# Patient Record
Sex: Male | Born: 2000 | Race: White | Hispanic: No | Marital: Single | Smoking: Never smoker
Health system: Southern US, Community
[De-identification: ages and names within clinical notes are randomized; demographics above are authoritative.]

## PROBLEM LIST (undated history)

## (undated) DIAGNOSIS — L0291 Cutaneous abscess, unspecified: Secondary | ICD-10-CM

## (undated) DIAGNOSIS — L0390 Cellulitis, unspecified: Secondary | ICD-10-CM

## (undated) DIAGNOSIS — Z8709 Personal history of other diseases of the respiratory system: Secondary | ICD-10-CM

## (undated) HISTORY — DX: Cellulitis, unspecified: L03.90

## (undated) HISTORY — DX: Personal history of other diseases of the respiratory system: Z87.09

## (undated) HISTORY — PX: APPENDECTOMY: SHX54

## (undated) HISTORY — PX: ADENOIDECTOMY: SHX5191

## (undated) HISTORY — PX: TONSILLECTOMY: SUR1361

## (undated) HISTORY — DX: Cutaneous abscess, unspecified: L02.91

---
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
## 2022-01-03 ENCOUNTER — Ambulatory Visit: Payer: Managed Care, Other (non HMO) | Admitting: Physician Assistant

## 2022-01-10 ENCOUNTER — Encounter: Payer: Self-pay | Admitting: Physician Assistant

## 2022-01-10 ENCOUNTER — Ambulatory Visit (INDEPENDENT_AMBULATORY_CARE_PROVIDER_SITE_OTHER): Payer: Managed Care, Other (non HMO) | Admitting: Physician Assistant

## 2022-01-10 VITALS — BP 134/63 | HR 95 | Ht 69.0 in | Wt 289.1 lb

## 2022-01-10 DIAGNOSIS — I10 Essential (primary) hypertension: Secondary | ICD-10-CM | POA: Diagnosis not present

## 2022-01-10 DIAGNOSIS — R7989 Other specified abnormal findings of blood chemistry: Secondary | ICD-10-CM | POA: Diagnosis not present

## 2022-01-10 DIAGNOSIS — Z23 Encounter for immunization: Secondary | ICD-10-CM

## 2022-01-10 MED ORDER — LISINOPRIL 5 MG PO TABS: 5.0000 mg | ORAL_TABLET | Freq: Every day | ORAL | 1 refills | Status: DC

## 2022-01-10 NOTE — Progress Notes (Signed)
   Established Patient Office Visit  Subjective   Patient ID: Jordan Cordova, male    DOB: 11-21-2000  Age: 22 y.o. MRN: [id number]  Chief Complaint  Patient presents with   Follow-up    6 month      HPI Pt is a 21 yo male with HTN and elevated TSH who presents to the clinic for follow up and medication refills.   Pt is doing great today. Pt denies any concerns or problems. He denies any CP, palpitations, headaches or vision changes. He is compliant with medications.   .. Active Ambulatory Problems    Diagnosis Date Noted   Elevated blood pressure reading 08/10/2020   Tachycardia 08/10/2020   Class 3 severe obesity due to excess calories without serious comorbidity with body mass index (BMI) of 40.0 to 44.9 in adult [facility]) 08/10/2020   Microalbuminuria 08/10/2020   Elevated liver enzymes 08/11/2020   Elevated TSH 08/11/2020   Primary hypertension 09/10/2020   Mixed dyslipidemia 01/02/2012   Resolved Ambulatory Problems    Diagnosis Date Noted   ALLERGIC RHINITIS 05/24/2009   CELLULITIS, METHICILLIN RESISTANT STAPHYLOCCOCUS AREUS 05/24/2009   OTITIS MEDIA, ACUTE 09/01/2010   Past Medical History:  Diagnosis Date   Asthma    History of strep pharyngitis      ROS   See HPI.  Objective:     BP 134/63   Pulse 95   Ht 5\' 9"  (1.753 m)   Wt 289 lb 1.9 oz (131.1 kg)   SpO2 97%   BMI 42.70 kg/m  BP Readings from Last 3 Encounters:  01/10/22 134/63  07/01/21 118/88  01/13/21 140/75   Wt Readings from Last 3 Encounters:  01/10/22 289 lb 1.9 oz (131.1 kg) (>99 %, Z= 2.94)*  07/01/21 281 lb (127.5 kg) (>99 %, Z= 2.84)*  01/13/21 267 lb (121.1 kg) (>99 %, Z= 2.67)*   * Growth percentiles are based on CDC (Boys, 2-20 Years) data.      Physical Exam   Assessment & Plan:  Marland KitchenMarland KitchenAsante was seen today for follow-up.  Diagnoses and all orders for this visit:  Primary hypertension -     COMPLETE METABOLIC PANEL WITH GFR -     lisinopril (ZESTRIL) 5 MG tablet; Take  1 tablet (5 mg total) by mouth daily.  Elevated TSH -     TSH  Need for influenza vaccination -     Flu Vaccine QUAD 6+ mos PF IM (Fluarix Quad PF)   BP to goal Lisinopril 5mg  sent to pharmacy Cmp ordered  TSH to recheck and adjust medications accordingly  Flu shot given today    Return in about 6 months (around 07/11/2022).    Iran Planas, PA-C

## 2022-01-11 ENCOUNTER — Other Ambulatory Visit: Payer: Self-pay | Admitting: Physician Assistant

## 2022-01-11 LAB — COMPLETE METABOLIC PANEL WITH GFR
AG Ratio: 1.8 (calc) (ref 1.0–2.5)
ALT: 70 U/L — ABNORMAL HIGH (ref 8–46)
AST: 27 U/L (ref 12–32)
Albumin: 4.5 g/dL (ref 3.6–5.1)
Alkaline phosphatase (APISO): 67 U/L (ref 46–169)
BUN: 10 mg/dL (ref 7–20)
CO2: 27 mmol/L (ref 20–32)
Calcium: 9.5 mg/dL (ref 8.9–10.4)
Chloride: 105 mmol/L (ref 98–110)
Creat: 0.67 mg/dL (ref 0.60–1.24)
Globulin: 2.5 g/dL (calc) (ref 2.1–3.5)
Glucose, Bld: 111 mg/dL — ABNORMAL HIGH (ref 65–99)
Potassium: 4.2 mmol/L (ref 3.8–5.1)
Sodium: 140 mmol/L (ref 135–146)
Total Bilirubin: 0.5 mg/dL (ref 0.2–1.1)
Total Protein: 7 g/dL (ref 6.3–8.2)
eGFR: 138 mL/min/{1.73_m2} (ref >=60)

## 2022-01-11 LAB — TSH: TSH: 1.6 mIU/L (ref 0.50–4.30)

## 2022-01-11 MED ORDER — LEVOTHYROXINE SODIUM 25 MCG PO TABS: 25.0000 ug | ORAL_TABLET | Freq: Every day | ORAL | 3 refills | Status: DC

## 2022-01-11 NOTE — Progress Notes (Signed)
Thyroid looks perfect!  ALT continues to be elevated. Are you drinking alcohol? If so how much?

## 2022-01-12 ENCOUNTER — Encounter: Payer: Self-pay | Admitting: Neurology

## 2022-07-11 ENCOUNTER — Ambulatory Visit: Payer: Managed Care, Other (non HMO) | Admitting: Physician Assistant

## 2022-07-17 ENCOUNTER — Encounter: Payer: Self-pay | Admitting: Physician Assistant

## 2022-07-17 ENCOUNTER — Ambulatory Visit: Payer: Managed Care, Other (non HMO) | Admitting: Physician Assistant

## 2022-07-17 VITALS — BP 145/87 | HR 93 | Ht 69.0 in | Wt 312.0 lb

## 2022-07-17 DIAGNOSIS — Z23 Encounter for immunization: Secondary | ICD-10-CM | POA: Diagnosis not present

## 2022-07-17 DIAGNOSIS — I10 Essential (primary) hypertension: Secondary | ICD-10-CM

## 2022-07-17 DIAGNOSIS — E039 Hypothyroidism, unspecified: Secondary | ICD-10-CM

## 2022-07-17 DIAGNOSIS — R7401 Elevation of levels of liver transaminase levels: Secondary | ICD-10-CM

## 2022-07-17 DIAGNOSIS — R7989 Other specified abnormal findings of blood chemistry: Secondary | ICD-10-CM | POA: Diagnosis not present

## 2022-07-17 DIAGNOSIS — Z6841 Body Mass Index (BMI) 40.0 and over, adult: Secondary | ICD-10-CM

## 2022-07-17 DIAGNOSIS — Z13220 Encounter for screening for lipoid disorders: Secondary | ICD-10-CM

## 2022-07-17 MED ORDER — LISINOPRIL 10 MG PO TABS: 10.0000 mg | ORAL_TABLET | Freq: Every day | ORAL | 1 refills | Status: DC

## 2022-07-17 NOTE — Patient Instructions (Signed)
Increase lisinopril to 10mg .  Get labs when fasting.

## 2022-07-17 NOTE — Progress Notes (Signed)
Established Patient Office Visit  Subjective   Patient ID: Jordan Cordova, male    DOB: 2000-08-13  Age: 22 y.o. MRN: [id number]  Chief Complaint  Patient presents with   Follow-up    HPI Pt is a 22 yo obese male with HTN and hypothyroidism who presents to the clinic for follow up and medication refills.   He is doing really good. No concerns. Not checking BP at home. No CP, palpitations, SOB, headaches, or vision changes. He does feel better with thyroid medications.    .. Active Ambulatory Problems    Diagnosis Date Noted   Elevated blood pressure reading 08/10/2020   Tachycardia 08/10/2020   Class 3 severe obesity due to excess calories without serious comorbidity with body mass index (BMI) of 40.0 to 44.9 in adult [facility]) 08/10/2020   Microalbuminuria 08/10/2020   Elevated liver enzymes 08/11/2020   Elevated TSH 08/11/2020   Primary hypertension 09/10/2020   Mixed dyslipidemia 01/02/2012   Acquired hypothyroidism 07/17/2022   Resolved Ambulatory Problems    Diagnosis Date Noted   ALLERGIC RHINITIS 05/24/2009   CELLULITIS, METHICILLIN RESISTANT STAPHYLOCCOCUS AREUS 05/24/2009   OTITIS MEDIA, ACUTE 09/01/2010   Past Medical History:  Diagnosis Date   Asthma    History of strep pharyngitis      Review of Systems  All other systems reviewed and are negative.     Objective:     BP (!) 145/87   Pulse 93   Ht 5\' 9"  (1.753 m)   Wt (!) 312 lb (141.5 kg)   SpO2 99%   BMI 46.07 kg/m  BP Readings from Last 3 Encounters:  07/17/22 (!) 145/87  01/10/22 134/63  07/01/21 118/88   Wt Readings from Last 3 Encounters:  07/17/22 (!) 312 lb (141.5 kg)  01/10/22 289 lb 1.9 oz (131.1 kg) (>99 %, Z= 2.94)*  07/01/21 281 lb (127.5 kg) (>99 %, Z= 2.84)*   * Growth percentiles are based on CDC (Boys, 2-20 Years) data.      Physical Exam Constitutional:      Appearance: Normal appearance. He is obese.  HENT:     Head: Normocephalic.  Cardiovascular:     Rate and  Rhythm: Normal rate and regular rhythm.  Pulmonary:     Effort: Pulmonary effort is normal.     Breath sounds: Normal breath sounds.  Musculoskeletal:     Right lower leg: No edema.     Left lower leg: No edema.  Neurological:     General: No focal deficit present.     Mental Status: He is alert and oriented to person, place, and time.  Psychiatric:        Mood and Affect: Mood normal.       Assessment & Plan:  Marland KitchenMarland KitchenMomin was seen today for follow-up.  Diagnoses and all orders for this visit:  Primary hypertension -     COMPLETE METABOLIC PANEL WITH GFR -     lisinopril (ZESTRIL) 10 MG tablet; Take 1 tablet (10 mg total) by mouth daily.  Elevated TSH -     TSH  Screening for lipid disorders -     Lipid Panel w/reflex Direct LDL  Need for HPV vaccine -     HPV 9-valent vaccine,Recombinat  Acquired hypothyroidism   BP not to goal Increased lisinopril to 10mg  daily Screening labs to follow medications Will adjust levothyroxine accordingly HPV vaccine 3rd dose today.  Follow up in 6 months    Return in about 6 months (  around 01/17/2023), or if symptoms worsen or fail to improve.    Tandy Gaw, PA-C

## 2022-07-25 LAB — LIPID PANEL W/REFLEX DIRECT LDL
Cholesterol: 181 mg/dL (ref <200)
HDL: 44 mg/dL (ref >=40)
LDL Cholesterol (Calc): 120 mg/dL (calc) — ABNORMAL HIGH
Non-HDL Cholesterol (Calc): 137 mg/dL (calc) — ABNORMAL HIGH (ref <130)
Total CHOL/HDL Ratio: 4.1 (calc) (ref <5.0)
Triglycerides: 78 mg/dL (ref <150)

## 2022-07-25 LAB — COMPLETE METABOLIC PANEL WITH GFR
AG Ratio: 1.8 (calc) (ref 1.0–2.5)
ALT: 82 U/L — ABNORMAL HIGH (ref 9–46)
AST: 30 U/L (ref 10–40)
Albumin: 4.5 g/dL (ref 3.6–5.1)
Alkaline phosphatase (APISO): 58 U/L (ref 36–130)
BUN: 10 mg/dL (ref 7–25)
CO2: 26 mmol/L (ref 20–32)
Calcium: 9.4 mg/dL (ref 8.6–10.3)
Chloride: 104 mmol/L (ref 98–110)
Creat: 0.68 mg/dL (ref 0.60–1.24)
Globulin: 2.5 g/dL (calc) (ref 1.9–3.7)
Glucose, Bld: 84 mg/dL (ref 65–99)
Potassium: 4.4 mmol/L (ref 3.5–5.3)
Sodium: 139 mmol/L (ref 135–146)
Total Bilirubin: 0.8 mg/dL (ref 0.2–1.2)
Total Protein: 7 g/dL (ref 6.1–8.1)
eGFR: 136 mL/min/{1.73_m2} (ref >=60)

## 2022-07-25 LAB — TSH: TSH: 2.95 mIU/L (ref 0.40–4.50)

## 2022-07-25 NOTE — Progress Notes (Signed)
Filbert,  Thyroid normal range. Make sure to take medication first thing in morning without any other food or medication.  LDL 120, not optimal, but ok for age and risk factors. Continue to work on [employer] and exercise.  ALT up a little. I would like to get liver ultrasound. Are you drinking alcohol? Are you taking tylenol regularly?

## 2022-07-25 NOTE — Addendum Note (Signed)
Addended byTandy Gaw L on: 07/25/2022 01:00 PM   Modules accepted: Orders

## 2023-01-02 ENCOUNTER — Encounter: Payer: Self-pay | Admitting: Physician Assistant

## 2023-01-12 ENCOUNTER — Ambulatory Visit: Payer: Managed Care, Other (non HMO)

## 2023-01-12 DIAGNOSIS — R7401 Elevation of levels of liver transaminase levels: Secondary | ICD-10-CM | POA: Diagnosis not present

## 2023-01-12 DIAGNOSIS — E66813 Obesity, class 3: Secondary | ICD-10-CM

## 2023-01-16 NOTE — Progress Notes (Signed)
Jordan Cordova,   Liver is showing some that can be seen in cirrhosis.  We need to get a special electrography u/s to look for signs that the liver is hardening.  Are you ok with this?  Stop drinking any alcohol.

## 2023-01-17 ENCOUNTER — Ambulatory Visit: Payer: Managed Care, Other (non HMO) | Admitting: Physician Assistant

## 2023-01-17 ENCOUNTER — Encounter: Payer: Self-pay | Admitting: Physician Assistant

## 2023-01-17 VITALS — BP 128/82 | HR 80 | Ht 69.0 in | Wt 314.0 lb

## 2023-01-17 DIAGNOSIS — R748 Abnormal levels of other serum enzymes: Secondary | ICD-10-CM | POA: Diagnosis not present

## 2023-01-17 DIAGNOSIS — I10 Essential (primary) hypertension: Secondary | ICD-10-CM | POA: Diagnosis not present

## 2023-01-17 DIAGNOSIS — Z23 Encounter for immunization: Secondary | ICD-10-CM

## 2023-01-17 DIAGNOSIS — K760 Fatty (change of) liver, not elsewhere classified: Secondary | ICD-10-CM | POA: Diagnosis not present

## 2023-01-17 DIAGNOSIS — R7989 Other specified abnormal findings of blood chemistry: Secondary | ICD-10-CM | POA: Diagnosis not present

## 2023-01-17 DIAGNOSIS — E66813 Obesity, class 3: Secondary | ICD-10-CM

## 2023-01-17 DIAGNOSIS — Z6841 Body Mass Index (BMI) 40.0 and over, adult: Secondary | ICD-10-CM

## 2023-01-17 MED ORDER — ZEPBOUND 2.5 MG/0.5ML ~~LOC~~ SOAJ: 2.5000 mg | SUBCUTANEOUS | 0 refills | Status: DC

## 2023-01-17 MED ORDER — LEVOTHYROXINE SODIUM 25 MCG PO TABS: 25.0000 ug | ORAL_TABLET | Freq: Every day | ORAL | 3 refills | Status: DC

## 2023-01-17 MED ORDER — LISINOPRIL 10 MG PO TABS: 10.0000 mg | ORAL_TABLET | Freq: Every day | ORAL | 1 refills | Status: DC

## 2023-01-17 NOTE — Progress Notes (Signed)
Established Patient Office Visit  Subjective   Patient ID: Jordan Cordova, male    DOB: [birth year]  Age: 22 y.o. MRN: [id number]  Chief Complaint  Patient presents with   Medical Management of Chronic Issues    6 mo fup rx refills and labs     HPI Pt is a 22 yo obese male with HTN, hypothyroidism who presents to the clinic for follow up on medications and elevated liver enzymes.   Pt has had mildly elevated liver enzymes that are trending up with u/s that showed fatty liver and questionable signs of cirrhosis. Pt does not drink alcohol or take tylenol products. He is not exercising.   He denies any CP, palpitations, headaches or vision changes. He is compliant with medications.   Active Ambulatory Problems    Diagnosis Date Noted   Elevated blood pressure reading 08/10/2020   Tachycardia 08/10/2020   Class 3 severe obesity due to excess calories with serious comorbidity and body mass index (BMI) of 40.0 to 44.9 in adult [facility]) 08/10/2020   Microalbuminuria 08/10/2020   Elevated liver enzymes 08/11/2020   Elevated TSH 08/11/2020   Primary hypertension 09/10/2020   Mixed dyslipidemia 01/02/2012   Acquired hypothyroidism 07/17/2022   Fatty liver disease, nonalcoholic 01/17/2023   Resolved Ambulatory Problems    Diagnosis Date Noted   Allergic rhinitis 05/24/2009   CELLULITIS, METHICILLIN RESISTANT STAPHYLOCCOCUS AREUS 05/24/2009   Otitis media 09/01/2010   Past Medical History:  Diagnosis Date   Asthma    History of strep pharyngitis      ROS   See HPI.  Objective:     BP 128/82  BP Readings from Last 3 Encounters:  01/17/23 128/82  07/17/22 (!) 145/87  01/10/22 134/63   Wt Readings from Last 3 Encounters:  07/17/22 (!) 312 lb (141.5 kg)  01/10/22 289 lb 1.9 oz (131.1 kg) (>99%, Z= 2.94)*  07/01/21 281 lb (127.5 kg) (>99%, Z= 2.84)*   * Growth percentiles are based on CDC (Boys, 2-20 Years) data.      Physical Exam Constitutional:      Appearance:  Normal appearance. He is obese.  HENT:     Head: Normocephalic.  Cardiovascular:     Rate and Rhythm: Normal rate and regular rhythm.  Pulmonary:     Effort: Pulmonary effort is normal.  Neurological:     General: No focal deficit present.     Mental Status: He is alert and oriented to person, place, and time.  Psychiatric:        Mood and Affect: Mood normal.       Assessment & Plan:  Marland KitchenMarland KitchenYama was seen today for medical management of chronic issues.  Diagnoses and all orders for this visit:  Fatty liver disease, nonalcoholic -     tirzepatide (ZEPBOUND) 2.5 MG/0.5ML Pen; Inject 2.5 mg into the skin once a week. -     CMP14+EGFR -     [place] ELASTOGRAPHY LIVER; Future  Primary hypertension -     lisinopril (ZESTRIL) 10 MG tablet; Take 1 tablet (10 mg total) by mouth daily. -     tirzepatide (ZEPBOUND) 2.5 MG/0.5ML Pen; Inject 2.5 mg into the skin once a week. 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 -     tirzepatide (ZEPBOUND) 2.5 MG/0.5ML Pen; Inject 2.5 mg into the skin once a week. -     TSH  Elevated liver enzymes -     tirzepatide (ZEPBOUND) 2.5 MG/0.5ML Pen; Inject 2.5 mg into the skin once a week. -     [place] ELASTOGRAPHY LIVER; Future  Class 3 severe obesity due to excess calories with serious comorbidity and body mass index (BMI) of 40.0 to 44.9 in adult (HCC) -     tirzepatide (ZEPBOUND) 2.5 MG/0.5ML Pen; Inject 2.5 mg into the skin once a week. -     [place] ELASTOGRAPHY LIVER; Future   2nd recheck BP looks good Continue on lisinopril daily  TSH to recheck today and adjust accordingly  Will order elastography u/s for elevated liver enzymes We need to work on weight loss Discussed zepbound and pt agreed Pt  aware of side effects and titration up Discussed regular exercise and diet changes to get best results Sent first dose and follow up in 4 weeks  Flu and covid vaccines given today  Tandy Gaw, PA-C

## 2023-01-17 NOTE — Patient Instructions (Signed)
Work on 150 minutes of exercise a week.  Start zepbound weekly.  Follow up in 1 month.

## 2023-01-18 ENCOUNTER — Telehealth: Payer: Self-pay

## 2023-01-18 LAB — CMP14+EGFR
ALT: 92 [IU]/L — ABNORMAL HIGH (ref 0–44)
AST: 39 [IU]/L (ref 0–40)
Albumin: 4.4 g/dL (ref 4.3–5.2)
Alkaline Phosphatase: 71 [IU]/L (ref 51–125)
BUN/Creatinine Ratio: 14 (ref 9–20)
BUN: 9 mg/dL (ref 6–20)
Bilirubin Total: 0.3 mg/dL (ref 0.0–1.2)
CO2: 21 mmol/L (ref 20–29)
Calcium: 9.4 mg/dL (ref 8.7–10.2)
Chloride: 102 mmol/L (ref 96–106)
Creatinine, Ser: 0.64 mg/dL — ABNORMAL LOW (ref 0.76–1.27)
Globulin, Total: 2.5 g/dL (ref 1.5–4.5)
Glucose: 97 mg/dL (ref 70–99)
Potassium: 4.9 mmol/L (ref 3.5–5.2)
Sodium: 139 mmol/L (ref 134–144)
Total Protein: 6.9 g/dL (ref 6.0–8.5)
eGFR: 139 mL/min/{1.73_m2} (ref >59)

## 2023-01-18 LAB — TSH: TSH: 1.74 u[IU]/mL (ref 0.450–4.500)

## 2023-01-18 NOTE — Telephone Encounter (Signed)
Initiated Prior authorization for: Via: Covermymeds Case/Key:[facility] Status: approved  as of 01/18/23 Reason:Authorization Expiration Date: 09/14/2023 Notified Pt via: Mychart

## 2023-01-19 NOTE — Progress Notes (Signed)
Thyroid looks great. Stay on same dose.   One liver enzymes continues to climb a little at a time. Treatment plan stays the same for now.

## 2023-02-09 ENCOUNTER — Other Ambulatory Visit: Payer: Self-pay | Admitting: Physician Assistant

## 2023-02-09 DIAGNOSIS — K760 Fatty (change of) liver, not elsewhere classified: Secondary | ICD-10-CM

## 2023-02-09 DIAGNOSIS — I10 Essential (primary) hypertension: Secondary | ICD-10-CM

## 2023-02-09 DIAGNOSIS — E66813 Obesity, class 3: Secondary | ICD-10-CM

## 2023-02-09 DIAGNOSIS — R7989 Other specified abnormal findings of blood chemistry: Secondary | ICD-10-CM

## 2023-02-09 DIAGNOSIS — R748 Abnormal levels of other serum enzymes: Secondary | ICD-10-CM

## 2023-02-12 MED ORDER — ZEPBOUND 2.5 MG/0.5ML ~~LOC~~ SOAJ: 2.5000 mg | SUBCUTANEOUS | 0 refills | Status: DC

## 2023-02-16 ENCOUNTER — Encounter: Payer: Self-pay | Admitting: Physician Assistant

## 2023-02-16 DIAGNOSIS — I10 Essential (primary) hypertension: Secondary | ICD-10-CM

## 2023-02-16 DIAGNOSIS — K760 Fatty (change of) liver, not elsewhere classified: Secondary | ICD-10-CM

## 2023-02-16 DIAGNOSIS — E039 Hypothyroidism, unspecified: Secondary | ICD-10-CM

## 2023-02-16 DIAGNOSIS — R7989 Other specified abnormal findings of blood chemistry: Secondary | ICD-10-CM

## 2023-02-16 DIAGNOSIS — R748 Abnormal levels of other serum enzymes: Secondary | ICD-10-CM

## 2023-02-16 MED ORDER — ZEPBOUND 5 MG/0.5ML ~~LOC~~ SOAJ: 5.0000 mg | SUBCUTANEOUS | 0 refills | Status: DC

## 2023-02-16 MED ORDER — ZEPBOUND 7.5 MG/0.5ML ~~LOC~~ SOAJ: 7.5000 mg | SUBCUTANEOUS | 0 refills | Status: DC

## 2023-04-20 ENCOUNTER — Other Ambulatory Visit: Payer: Self-pay | Admitting: Physician Assistant

## 2023-04-24 MED ORDER — ZEPBOUND 7.5 MG/0.5ML ~~LOC~~ SOAJ: 7.5000 mg | SUBCUTANEOUS | 0 refills | Status: DC

## 2023-06-06 ENCOUNTER — Other Ambulatory Visit: Payer: Self-pay | Admitting: Physician Assistant

## 2023-06-07 MED ORDER — ZEPBOUND 7.5 MG/0.5ML ~~LOC~~ SOAJ: 7.5000 mg | SUBCUTANEOUS | 0 refills | Status: DC

## 2023-07-04 NOTE — Telephone Encounter (Signed)
 This request has been handled. No further action is required. Please review other telephone encounters for additional information.

## 2023-07-27 ENCOUNTER — Other Ambulatory Visit: Payer: Self-pay | Admitting: Physician Assistant

## 2023-07-31 NOTE — Telephone Encounter (Signed)
Attempted call to patient. Mail box full. Could not leave a voice mail message.  

## 2023-08-01 NOTE — Telephone Encounter (Signed)
Attempted call to patient. Mail box full. Could not leave a voice mail message.  

## 2023-08-06 ENCOUNTER — Encounter: Payer: Self-pay | Admitting: Physician Assistant

## 2023-08-06 NOTE — Telephone Encounter (Signed)
 Letter placed in mail to patient

## 2023-08-21 ENCOUNTER — Other Ambulatory Visit (HOSPITAL_COMMUNITY): Payer: Self-pay

## 2023-08-21 ENCOUNTER — Telehealth: Payer: Self-pay

## 2023-08-21 NOTE — Telephone Encounter (Signed)
 Pt is scheduled for an appt 6/25.

## 2023-08-21 NOTE — Telephone Encounter (Signed)
 Pharmacy Patient Advocate Encounter   Received notification from CoverMyMeds that prior authorization for Zepbound  is due for renewal.   Insurance verification completed.   The patient is insured through [employer].   Action: Patient hasn't been seen in your office in over 6 months. Plan requires updated chart notes for PA renewal.

## 2023-08-27 ENCOUNTER — Encounter: Payer: Self-pay | Admitting: Physician Assistant

## 2023-08-27 ENCOUNTER — Ambulatory Visit: Admitting: Physician Assistant

## 2023-08-27 VITALS — BP 124/68 | HR 76 | Ht 69.0 in | Wt 306.0 lb

## 2023-08-27 DIAGNOSIS — Z6841 Body Mass Index (BMI) 40.0 and over, adult: Secondary | ICD-10-CM

## 2023-08-27 DIAGNOSIS — R7989 Other specified abnormal findings of blood chemistry: Secondary | ICD-10-CM

## 2023-08-27 DIAGNOSIS — R748 Abnormal levels of other serum enzymes: Secondary | ICD-10-CM

## 2023-08-27 DIAGNOSIS — E66813 Obesity, class 3: Secondary | ICD-10-CM | POA: Diagnosis not present

## 2023-08-27 DIAGNOSIS — K760 Fatty (change of) liver, not elsewhere classified: Secondary | ICD-10-CM | POA: Diagnosis not present

## 2023-08-27 DIAGNOSIS — I10 Essential (primary) hypertension: Secondary | ICD-10-CM

## 2023-08-27 MED ORDER — LISINOPRIL 10 MG PO TABS: 10.0000 mg | ORAL_TABLET | Freq: Every day | ORAL | 1 refills | Status: AC

## 2023-08-27 MED ORDER — LEVOTHYROXINE SODIUM 25 MCG PO TABS
25.0000 ug | ORAL_TABLET | Freq: Every day | ORAL | 1 refills | Status: AC
Start: 2023-08-27 — End: ?

## 2023-08-27 MED ORDER — ZEPBOUND 12.5 MG/0.5ML ~~LOC~~ SOAJ: 12.5000 mg | SUBCUTANEOUS | 1 refills | Status: AC

## 2023-08-27 MED ORDER — ZEPBOUND 10 MG/0.5ML ~~LOC~~ SOAJ: 10.0000 mg | SUBCUTANEOUS | 0 refills | Status: AC

## 2023-08-27 NOTE — Progress Notes (Signed)
   Established Patient Office Visit  Subjective   Patient ID: Jordan Cordova, male    DOB: 2000-05-31  Age: 23 y.o. MRN: [id number]  Chief Complaint  Patient presents with   Medical Management of Chronic Issues    Weight management      HPI Pt is a 23 yo obese male who presents to the clinic for weight loss follow up. He has been on zepbound  7.5mg  for 3 months. He has lost 7lbs. He denies any side effects. He really feels like the dose is not lasting all week. He is not exercising and not following a diet plan. He would like to go up to the next dose.   He is not checking BP at home. He is taking lisinopril  daily. He denies any CP, palpitations, headaches or vision changes.   He is taking synthroid  daily for thyroid.    ROS See HPI.    Objective:     BP 124/68   Pulse 76   Ht 5' 9 (1.753 m)   Wt (!) 306 lb (138.8 kg)   SpO2 99%   BMI 45.19 kg/m  BP Readings from Last 3 Encounters:  08/27/23 124/68  01/17/23 128/82  07/17/22 (!) 145/87   Wt Readings from Last 3 Encounters:  08/27/23 (!) 306 lb (138.8 kg)  01/17/23 (!) 314 lb (142.4 kg)  07/17/22 (!) 312 lb (141.5 kg)      Physical Exam Constitutional:      Appearance: Normal appearance. He is obese.  HENT:     Head: Normocephalic.   Cardiovascular:     Rate and Rhythm: Normal rate and regular rhythm.  Pulmonary:     Effort: Pulmonary effort is normal.   Neurological:     General: No focal deficit present.     Mental Status: He is alert and oriented to person, place, and time.   Psychiatric:        Mood and Affect: Mood normal.        Assessment & Plan:  Jordan Cordova was seen today for medical management of chronic issues.  Diagnoses and all orders for this visit:  Class 3 severe obesity due to excess calories without serious comorbidity with body mass index (BMI) of 45.0 to 49.9 in adult -     CMP14+EGFR -     tirzepatide  (ZEPBOUND ) 10 MG/0.5ML Pen; Inject 10 mg into the skin once a week. -      tirzepatide  (ZEPBOUND ) 12.5 MG/0.5ML Pen; Inject 12.5 mg into the skin once a week.  Fatty liver disease, nonalcoholic -     CMP14+EGFR  Elevated liver enzymes -     CMP14+EGFR  Elevated TSH -     levothyroxine  (SYNTHROID ) 25 MCG tablet; Take 1 tablet (25 mcg total) by mouth daily before breakfast.  Primary hypertension -     lisinopril  (ZESTRIL ) 10 MG tablet; Take 1 tablet (10 mg total) by mouth daily.   Increased zepbound  to 10mg  and then 12.5mg  in one month Discussed SEs Strongly encouraged patient to start with a clear diet plan and goal of exercising more regularly at least 3 times a week 2nd BP check was great, refilled lisinopril  Encouraged to start checking BP at home and keeping log Cmp ordered to recheck liver enzymes TSH looked great, refilled synthroid .  Follow up in 3 months.   Ellasyn Swilling, PA-C

## 2023-08-27 NOTE — Patient Instructions (Signed)
 Obesity, Adult Obesity is having too much body fat. Being obese means that your weight is more than what is healthy for you.  BMI (body mass index) is a number that explains how much body fat you have. If you have a BMI of 30 or more, you are obese. Obesity can cause serious health problems, such as: Stroke. Coronary artery disease (CAD). Type 2 diabetes. Some types of cancer. High blood pressure (hypertension). High cholesterol. Gallbladder stones. Obesity can also contribute to: Osteoarthritis. Sleep apnea. Infertility problems. What are the causes? Eating meals each day that are high in calories, sugar, and fat. Drinking a lot of drinks that have sugar in them. Being born with genes that may make you more likely to become obese. Having a medical condition that causes obesity. Taking certain medicines. Sitting a lot (having a sedentary lifestyle). Not getting enough sleep. What increases the risk? Having a family history of obesity. Living in an area with limited access to: [place], recreation centers, or sidewalks. Healthy food choices, such as grocery stores and farmers' markets. What are the signs or symptoms? The main sign is having too much body fat. How is this treated? Treatment for this condition often includes changing your lifestyle. Treatment may include: Changing your diet. This may include making a healthy meal plan. Exercise. This may include activity that causes your heart to beat faster (aerobic exercise) and strength training. Work with your doctor to design a program that works for you. Medicine to help you lose weight. This may be used if you are not able to lose one pound a week after 6 weeks of healthy eating and more exercise. Treating conditions that cause the obesity. Surgery. Options may include gastric banding and gastric bypass. This may be done if: Other treatments have not helped to improve your condition. You have a BMI of 40 or higher. You have  life-threatening health problems related to obesity. Follow these instructions at home: Eating and drinking  Follow advice from your doctor about what to eat and drink. Your doctor may tell you to: Limit fast food, sweets, and processed snack foods. Choose low-fat options. For example, choose low-fat milk instead of whole milk. Eat five or more servings of fruits or vegetables each day. Eat at home more often. This gives you more control over what you eat. Choose healthy foods when you eat out. Learn to read food labels. This will help you learn how much food is in one serving. Keep low-fat snacks available. Avoid drinks that have a lot of sugar in them. These include soda, fruit juice, iced tea with sugar, and flavored milk. Drink enough water to keep your pee (urine) pale yellow. Do not go on fad diets. Physical activity Exercise often, as told by your doctor. Most adults should get up to 150 minutes of moderate-intensity exercise every week.Ask your doctor: What types of exercise are safe for you. How often you should exercise. Warm up and stretch before being active. Do slow stretching after being active (cool down). Rest between times of being active. Lifestyle Work with your doctor and a food expert (dietitian) to set a weight-loss goal that is best for you. Limit your screen time. Find ways to reward yourself that do not involve food. Do not drink alcohol if: Your doctor tells you not to drink. You are pregnant, may be pregnant, or are planning to become pregnant. If you drink alcohol: Limit how much you have to: 0-1 drink a day for women. 0-2 drinks  a day for men. Know how much alcohol is in your drink. In the [place] one drink equals one 12 oz bottle of beer (355 mL), one 5 oz glass of wine (148 mL), or one 1 oz glass of hard liquor (44 mL). General instructions Keep a weight-loss journal. This can help you keep track of: The food that you eat. How much exercise you  get. Take over-the-counter and prescription medicines only as told by your doctor. Take vitamins and supplements only as told by your doctor. Think about joining a support group. Pay attention to your mental health as obesity can lead to depression or self esteem issues. Keep all follow-up visits. Contact a doctor if: You cannot meet your weight-loss goal after you have changed your diet and lifestyle for 6 weeks. You are having trouble breathing. Summary Obesity is having too much body fat. Being obese means that your weight is more than what is healthy for you. Work with your doctor to set a weight-loss goal. Get regular exercise as told by your doctor. This information is not intended to replace advice given to you by your health care provider. Make sure you discuss any questions you have with your health care provider. Document Revised: 09/28/2020 Document Reviewed: 09/28/2020 Elsevier Patient Education  2024 [employer].

## 2023-08-28 ENCOUNTER — Ambulatory Visit: Payer: Self-pay | Admitting: Physician Assistant

## 2023-08-28 LAB — CMP14+EGFR
ALT: 78 IU/L — ABNORMAL HIGH (ref 0–44)
AST: 36 IU/L (ref 0–40)
Albumin: 4.6 g/dL (ref 4.3–5.2)
Alkaline Phosphatase: 65 IU/L (ref 44–121)
BUN/Creatinine Ratio: 14 (ref 9–20)
BUN: 11 mg/dL (ref 6–20)
Bilirubin Total: 1 mg/dL (ref 0.0–1.2)
CO2: 18 mmol/L — ABNORMAL LOW (ref 20–29)
Calcium: 9.6 mg/dL (ref 8.7–10.2)
Chloride: 102 mmol/L (ref 96–106)
Creatinine, Ser: 0.77 mg/dL (ref 0.76–1.27)
Globulin, Total: 2.7 g/dL (ref 1.5–4.5)
Glucose: 82 mg/dL (ref 70–99)
Potassium: 4.4 mmol/L (ref 3.5–5.2)
Sodium: 138 mmol/L (ref 134–144)
Total Protein: 7.3 g/dL (ref 6.0–8.5)
eGFR: 131 mL/min/{1.73_m2} (ref >59)

## 2023-08-28 NOTE — Progress Notes (Signed)
 GREAT news. Starting to see your liver enzyme come down some likely due to weight loss and better diet!

## 2023-08-29 ENCOUNTER — Telehealth: Payer: Self-pay

## 2023-08-29 ENCOUNTER — Other Ambulatory Visit (HOSPITAL_COMMUNITY): Payer: Self-pay

## 2023-08-29 NOTE — Telephone Encounter (Signed)
 Pharmacy Patient Advocate Encounter   Received notification from CoverMyMeds that prior authorization renewal for Zepbound  10mg /0.25ml is required/requested.   Insurance verification completed.   The patient is insured through [employer] .   Per test claim: PA required; PA started via CoverMyMeds. KEY [id number] . Waiting for clinical questions to populate.

## 2023-08-30 NOTE — Telephone Encounter (Signed)
 Clinical questions have been answered and PA submitted. PA currently Pending.

## 2023-09-05 ENCOUNTER — Other Ambulatory Visit (HOSPITAL_COMMUNITY): Payer: Self-pay

## 2023-09-05 NOTE — Telephone Encounter (Signed)
 Pharmacy Patient Advocate Encounter  Received notification from [employer] that Prior Authorization for Zepbound  10mg /0.66ml has been APPROVED from 08/30/23 to 12/04/23. Unable to obtain [place] due to refill too soon rejection, last fill date 08/27/23 next available fill date7/6/25   PA #/Case ID/Reference #: [id number]

## 2023-10-30 ENCOUNTER — Other Ambulatory Visit (HOSPITAL_COMMUNITY): Payer: Self-pay

## 2023-10-30 ENCOUNTER — Telehealth: Payer: Self-pay

## 2023-10-30 NOTE — Telephone Encounter (Signed)
 Pharmacy Patient Advocate Encounter  Received notification from [employer] that Prior Authorization for Zepbound  12.5MG /0.5ML pen-injectors  has been APPROVED from 09/30/23 to 10/29/24. Ran test claim, Copay is $24.99. This test claim was processed through [facility]- copay amounts may vary at other pharmacies due to pharmacy/plan contracts, or as the patient moves through the different stages of their insurance plan.   PA #/Case ID/Reference #: [id number]  *LVM with [facility] to process RX

## 2023-11-29 ENCOUNTER — Other Ambulatory Visit (HOSPITAL_COMMUNITY): Payer: Self-pay

## 2024-01-14 ENCOUNTER — Other Ambulatory Visit: Payer: Self-pay | Admitting: Physician Assistant

## 2024-01-14 DIAGNOSIS — K760 Fatty (change of) liver, not elsewhere classified: Secondary | ICD-10-CM

## 2024-01-14 DIAGNOSIS — Z6841 Body Mass Index (BMI) 40.0 and over, adult: Secondary | ICD-10-CM

## 2024-01-14 DIAGNOSIS — R748 Abnormal levels of other serum enzymes: Secondary | ICD-10-CM
